# Patient Record
Sex: Female | Born: 1940 | Race: White | Hispanic: No | Marital: Married | Smoking: Never smoker
Health system: Southern US, Community
[De-identification: ages and names within clinical notes are randomized; demographics above are authoritative.]

## PROBLEM LIST (undated history)

## (undated) DIAGNOSIS — G35 Multiple sclerosis: Secondary | ICD-10-CM

## (undated) DIAGNOSIS — K219 Gastro-esophageal reflux disease without esophagitis: Secondary | ICD-10-CM

## (undated) DIAGNOSIS — M1990 Unspecified osteoarthritis, unspecified site: Secondary | ICD-10-CM

## (undated) HISTORY — PX: HERNIA REPAIR: SHX51

## (undated) HISTORY — PX: COLONOSCOPY: SHX174

## (undated) HISTORY — DX: Gastro-esophageal reflux disease without esophagitis: K21.9

## (undated) HISTORY — PX: BREAST LUMPECTOMY: SHX2

## (undated) HISTORY — DX: Multiple sclerosis: G35

## (undated) HISTORY — PX: POLYPECTOMY: SHX149

## (undated) HISTORY — DX: Unspecified osteoarthritis, unspecified site: M19.90

## (undated) HISTORY — PX: ABDOMINAL HYSTERECTOMY: SHX81

---

## 1998-05-10 ENCOUNTER — Other Ambulatory Visit: Admission: RE | Admit: 1998-05-10 | Discharge: 1998-05-10 | Payer: Self-pay | Admitting: Gynecology

## 1998-06-13 ENCOUNTER — Ambulatory Visit (HOSPITAL_BASED_OUTPATIENT_CLINIC_OR_DEPARTMENT_OTHER): Admission: RE | Admit: 1998-06-13 | Discharge: 1998-06-13 | Payer: Self-pay | Admitting: General Surgery

## 1999-08-28 ENCOUNTER — Other Ambulatory Visit: Admission: RE | Admit: 1999-08-28 | Discharge: 1999-08-28 | Payer: Self-pay | Admitting: Gynecology

## 2000-09-09 ENCOUNTER — Other Ambulatory Visit: Admission: RE | Admit: 2000-09-09 | Discharge: 2000-09-09 | Payer: Self-pay | Admitting: Gynecology

## 2001-12-15 ENCOUNTER — Other Ambulatory Visit: Admission: RE | Admit: 2001-12-15 | Discharge: 2001-12-15 | Payer: Self-pay | Admitting: Gynecology

## 2004-01-26 ENCOUNTER — Other Ambulatory Visit: Admission: RE | Admit: 2004-01-26 | Discharge: 2004-01-26 | Payer: Self-pay | Admitting: Gynecology

## 2005-01-31 ENCOUNTER — Other Ambulatory Visit: Admission: RE | Admit: 2005-01-31 | Discharge: 2005-01-31 | Payer: Self-pay | Admitting: Gynecology

## 2005-09-20 ENCOUNTER — Ambulatory Visit: Payer: Self-pay | Admitting: Internal Medicine

## 2005-10-04 ENCOUNTER — Encounter (INDEPENDENT_AMBULATORY_CARE_PROVIDER_SITE_OTHER): Payer: Self-pay | Admitting: Specialist

## 2005-10-04 ENCOUNTER — Ambulatory Visit: Payer: Self-pay | Admitting: Internal Medicine

## 2006-02-03 ENCOUNTER — Other Ambulatory Visit: Admission: RE | Admit: 2006-02-03 | Discharge: 2006-02-03 | Payer: Self-pay | Admitting: Gynecology

## 2007-02-09 ENCOUNTER — Other Ambulatory Visit: Admission: RE | Admit: 2007-02-09 | Discharge: 2007-02-09 | Payer: Self-pay | Admitting: Gynecology

## 2008-02-17 ENCOUNTER — Encounter: Payer: Self-pay | Admitting: Gynecology

## 2008-02-17 ENCOUNTER — Ambulatory Visit: Payer: Self-pay | Admitting: Gynecology

## 2008-02-17 ENCOUNTER — Other Ambulatory Visit: Admission: RE | Admit: 2008-02-17 | Discharge: 2008-02-17 | Payer: Self-pay | Admitting: Gynecology

## 2008-03-28 ENCOUNTER — Ambulatory Visit: Payer: Self-pay | Admitting: Gynecology

## 2008-04-04 ENCOUNTER — Ambulatory Visit: Payer: Self-pay | Admitting: Gynecology

## 2008-08-18 ENCOUNTER — Encounter (INDEPENDENT_AMBULATORY_CARE_PROVIDER_SITE_OTHER): Payer: Self-pay | Admitting: *Deleted

## 2008-09-15 ENCOUNTER — Ambulatory Visit: Payer: Self-pay | Admitting: Internal Medicine

## 2008-09-29 ENCOUNTER — Encounter: Payer: Self-pay | Admitting: Internal Medicine

## 2008-09-29 ENCOUNTER — Ambulatory Visit: Payer: Self-pay | Admitting: Internal Medicine

## 2008-10-03 ENCOUNTER — Encounter: Payer: Self-pay | Admitting: Internal Medicine

## 2009-02-17 ENCOUNTER — Other Ambulatory Visit: Admission: RE | Admit: 2009-02-17 | Discharge: 2009-02-17 | Payer: Self-pay | Admitting: Gynecology

## 2009-02-17 ENCOUNTER — Ambulatory Visit: Payer: Self-pay | Admitting: Gynecology

## 2009-02-22 ENCOUNTER — Ambulatory Visit: Payer: Self-pay | Admitting: Gynecology

## 2009-03-01 ENCOUNTER — Ambulatory Visit (HOSPITAL_COMMUNITY): Admission: RE | Admit: 2009-03-01 | Discharge: 2009-03-01 | Payer: Self-pay | Admitting: Gynecology

## 2009-03-16 ENCOUNTER — Ambulatory Visit
Admission: RE | Admit: 2009-03-16 | Discharge: 2009-03-16 | Payer: Self-pay | Source: Home / Self Care | Admitting: Gynecologic Oncology

## 2009-08-24 ENCOUNTER — Ambulatory Visit: Payer: Self-pay | Admitting: Gynecology

## 2010-02-19 ENCOUNTER — Ambulatory Visit
Admission: RE | Admit: 2010-02-19 | Discharge: 2010-02-19 | Payer: Self-pay | Source: Home / Self Care | Attending: Gynecology | Admitting: Gynecology

## 2010-02-19 ENCOUNTER — Other Ambulatory Visit
Admission: RE | Admit: 2010-02-19 | Discharge: 2010-02-19 | Payer: Self-pay | Source: Home / Self Care | Admitting: Gynecology

## 2011-09-23 ENCOUNTER — Encounter: Payer: Self-pay | Admitting: Internal Medicine

## 2011-10-01 ENCOUNTER — Encounter: Payer: Self-pay | Admitting: Internal Medicine

## 2011-10-03 ENCOUNTER — Ambulatory Visit (AMBULATORY_SURGERY_CENTER): Payer: Medicare Other | Admitting: *Deleted

## 2011-10-03 VITALS — Ht 64.5 in | Wt 143.0 lb

## 2011-10-03 DIAGNOSIS — Z860100 Personal history of colon polyps, unspecified: Secondary | ICD-10-CM

## 2011-10-03 DIAGNOSIS — Z800 Family history of malignant neoplasm of digestive organs: Secondary | ICD-10-CM

## 2011-10-03 DIAGNOSIS — Z1211 Encounter for screening for malignant neoplasm of colon: Secondary | ICD-10-CM

## 2011-10-03 MED ORDER — PEG-KCL-NACL-NASULF-NA ASC-C 100 G PO SOLR: ORAL | Status: DC

## 2011-10-03 NOTE — Progress Notes (Signed)
No allergy to eggs or soy products 

## 2011-11-12 ENCOUNTER — Encounter: Payer: Self-pay | Admitting: Internal Medicine

## 2011-11-12 ENCOUNTER — Ambulatory Visit (AMBULATORY_SURGERY_CENTER): Payer: Medicare Other | Admitting: Internal Medicine

## 2011-11-12 VITALS — BP 132/65 | HR 53 | Temp 98.2°F | Resp 20 | Ht 64.0 in | Wt 143.0 lb

## 2011-11-12 DIAGNOSIS — Z860100 Personal history of colon polyps, unspecified: Secondary | ICD-10-CM

## 2011-11-12 DIAGNOSIS — D126 Benign neoplasm of colon, unspecified: Secondary | ICD-10-CM

## 2011-11-12 DIAGNOSIS — Z800 Family history of malignant neoplasm of digestive organs: Secondary | ICD-10-CM

## 2011-11-12 DIAGNOSIS — Z1211 Encounter for screening for malignant neoplasm of colon: Secondary | ICD-10-CM

## 2011-11-12 MED ORDER — SODIUM CHLORIDE 0.9 % IV SOLN: 500.0000 mL | INTRAVENOUS | Status: DC

## 2011-11-12 NOTE — Progress Notes (Addendum)
Patient did not have preoperative order for IV antibiotic SSI prophylaxis. (G8918)  Patient did not experience any of the following events: a burn prior to discharge; a fall within the facility; wrong site/side/patient/procedure/implant event; or a hospital transfer or hospital admission upon discharge from the facility. (G8907)  

## 2011-11-12 NOTE — Patient Instructions (Addendum)
YOU HAD AN ENDOSCOPIC PROCEDURE TODAY AT THE [facility]: Refer to the procedure report that was given to you for any specific questions about what was found during the examination.  If the procedure report does not answer your questions, please call your gastroenterologist to clarify.  If you requested that your care partner not be given the details of your procedure findings, then the procedure report has been included in a sealed envelope for you to review at your convenience later.  YOU SHOULD EXPECT: Some feelings of bloating in the abdomen. Passage of more gas than usual.  Walking can help get rid of the air that was put into your GI tract during the procedure and reduce the bloating. If you had a lower endoscopy (such as a colonoscopy or flexible sigmoidoscopy) you may notice spotting of blood in your stool or on the toilet paper. If you underwent a bowel prep for your procedure, then you may not have a normal bowel movement for a few days.  DIET: Your first meal following the procedure should be a light meal and then it is ok to progress to your normal diet.  A half-sandwich or bowl of soup is an example of a good first meal.  Heavy or fried foods are harder to digest and may make you feel nauseous or bloated.  Likewise meals heavy in dairy and vegetables can cause extra gas to form and this can also increase the bloating.  Drink plenty of fluids but you should avoid alcoholic beverages for 24 hours.  ACTIVITY: Your care partner should take you home directly after the procedure.  You should plan to take it easy, moving slowly for the rest of the day.  You can resume normal activity the day after the procedure however you should NOT DRIVE or use heavy machinery for 24 hours (because of the sedation medicines used during the test).    SYMPTOMS TO REPORT IMMEDIATELY: A gastroenterologist can be reached at any hour.  During normal business hours, 8:30 AM to 5:00 PM Monday through Friday,  call [phone].  After hours and on weekends, please call the GI answering service at [phone] who will take a message and have the physician on call contact you.   Following lower endoscopy (colonoscopy or flexible sigmoidoscopy):  Excessive amounts of blood in the stool  Significant tenderness or worsening of abdominal pains  Swelling of the abdomen that is new, acute  Fever of 100F or higher  FOLLOW UP: If any biopsies were taken you will be contacted by phone or by letter within the next 1-3 weeks.  Call your gastroenterologist if you have not heard about the biopsies in 3 weeks.  Our staff will call the home number listed on your records the next business day following your procedure to check on you and address any questions or concerns that you may have at that time regarding the information given to you following your procedure. This is a courtesy call and so if there is no answer at the home number and we have not heard from you through the emergency physician on call, we will assume that you have returned to your regular daily activities without incident.  SIGNATURES/CONFIDENTIALITY: You and/or your care partner have signed paperwork which will be entered into your electronic medical record.  These signatures attest to the fact that that the information above on your After Visit Summary has been reviewed and is understood.  Full responsibility of the confidentiality of this   discharge information lies with you and/or your care-partner.   Thank-you for choosing us for your healthcare needs. 

## 2011-11-12 NOTE — Op Note (Signed)
[facility] [address] [place], [address]   COLONOSCOPY PROCEDURE REPORT  PATIENT: Sheena, Horton.  MR#: [id number] BIRTHDATE: 12-09-1940 , 71  yrs. old GENDER: Female ENDOSCOPIST: Roxy Cedar, MD REFERRED XB:[id number] Program Recall PROCEDURE DATE:  11/12/2011 PROCEDURE:   Colonoscopy with snare polypectomy  x 2 ASA CLASS:   Class II INDICATIONS:patient's immediate family history of colon cancer (sib 19); presonal hx multiple adenomas (2003,04,07,10). MEDICATIONS: MAC sedation, administered by CRNA and propofol (Diprivan) 260mg  IV  DESCRIPTION OF PROCEDURE:   After the risks benefits and alternatives of the procedure were thoroughly explained, informed consent was obtained.  A digital rectal exam revealed no abnormalities of the rectum.   The LB CF-[id number]  endoscope was introduced through the anus and advanced to the cecum, which was identified by both the appendix and ileocecal valve. No adverse events experienced.   The quality of the prep was excellent, using MoviPrep  The instrument was then slowly withdrawn as the colon was fully examined.      COLON FINDINGS: Two diminutive polyps were found in the transverse colon and descending colon.  A polypectomy was performed with a cold snare.  The resection was complete and the polyp tissue was completely retrieved.   Moderate diverticulosis was noted in the sigmoid colon.  Retroflexed views revealed no abnormalities. The time to cecum=5 minutes 25 seconds.  Withdrawal time=13 minutes 11 seconds.  The scope was withdrawn and the procedure completed. COMPLICATIONS: There were no complications.  ENDOSCOPIC IMPRESSION: 1.   Two diminutive polyps were found in the transverse colon and descending colon; polypectomy was performed with a cold snare 2.   Moderate diverticulosis was noted in the sigmoid colon  RECOMMENDATIONS: 1. Follow up colonoscopy in 5 years   eSigned:  Roxy Cedar, MD 11/12/2011 9:12 AM   cc: Feliciana Rossetti, MD and The Patient   PATIENT NAME:  Sheena, Horton. MR#: [id number]

## 2011-11-13 ENCOUNTER — Telehealth: Payer: Self-pay

## 2011-11-13 NOTE — Telephone Encounter (Signed)
  Follow up Call-  Call back number 11/12/2011  Post procedure Call Back phone  # [phone]  Permission to leave phone message Yes     Patient questions:  Do you have a fever, pain , or abdominal swelling? no Pain Score  0 *  Have you tolerated food without any problems? yes  Have you been able to return to your normal activities? yes  Do you have any questions about your discharge instructions: Diet   no Medications  no Follow up visit  no  Do you have questions or concerns about your Care? no  Actions: * If pain score is 4 or above: No action needed, pain <4.

## 2011-11-19 ENCOUNTER — Encounter: Payer: Self-pay | Admitting: Internal Medicine

## 2015-04-12 ENCOUNTER — Encounter: Payer: Self-pay | Admitting: Internal Medicine

## 2016-06-01 ENCOUNTER — Ambulatory Visit (HOSPITAL_COMMUNITY)
Admission: EM | Admit: 2016-06-01 | Discharge: 2016-06-01 | Disposition: A | Discharge status: Home / Self Care | Payer: Medicare Other | Attending: Family Medicine | Admitting: Family Medicine

## 2016-06-01 ENCOUNTER — Ambulatory Visit (INDEPENDENT_AMBULATORY_CARE_PROVIDER_SITE_OTHER): Payer: Medicare Other

## 2016-06-01 ENCOUNTER — Encounter (HOSPITAL_COMMUNITY): Payer: Self-pay | Admitting: Family Medicine

## 2016-06-01 DIAGNOSIS — K5900 Constipation, unspecified: Secondary | ICD-10-CM

## 2016-06-01 DIAGNOSIS — R109 Unspecified abdominal pain: Secondary | ICD-10-CM

## 2016-06-01 LAB — POCT URINALYSIS DIP (DEVICE)
Bilirubin Urine: NEGATIVE
GLUCOSE, UA: NEGATIVE mg/dL
Hgb urine dipstick: NEGATIVE
KETONES UR: NEGATIVE mg/dL
LEUKOCYTES UA: NEGATIVE
Nitrite: NEGATIVE
PROTEIN: NEGATIVE mg/dL
SPECIFIC GRAVITY, URINE: 1.025 (ref 1.005–1.030)
UROBILINOGEN UA: 0.2 mg/dL (ref 0.0–1.0)
pH: 7 (ref 5.0–8.0)

## 2016-06-01 NOTE — ED Provider Notes (Signed)
[facility]    CSN: [id number] Arrival date & time: 06/01/16  1203     History   Chief Complaint Chief Complaint  Patient presents with  . Abdominal Pain    HPI Sheena Horton is a 76 y.o. female. She is here with her daughter.  HPI Earlier today, the patient was shopping and noticed a 10/10 pain in her lower abdomen. It was very sharp and caused her to start sweating as well. She had intense nausea when this happens well. Since that time, it is turned into a dull ache and she rates as a 5/10 pain it does not radiate. She has had atypical presentation of urinary tract infections before and states this may be it. She denies any fevers, urinary complaints, current nausea, vomiting, fevers, recent travel, constipation, diarrhea, or bleeding.   Past Medical History:  Diagnosis Date  . Arthritis   . GERD (gastroesophageal reflux disease)   . Multiple sclerosis ([place])    dx in 1984 with mild case of MS, no medications or treatments, has had no symptoms     Past Surgical History:  Procedure Laterality Date  . ABDOMINAL HYSTERECTOMY    . BREAST LUMPECTOMY     left  . HERNIA REPAIR      Home Medications    Prior to Admission medications   Medication Sig Start Date End Date Taking? Authorizing Provider  CALCIUM PO Take 1 tablet by mouth daily.    Historical Provider, MD  Flaxseed, Linseed, (FLAXSEED OIL PO) Take by mouth daily.    Historical Provider, MD  Omega-3 Fatty Acids (FISH OIL PO) Take 1 tablet by mouth daily.    Historical Provider, MD    Family History Family History  Problem Relation Age of Onset  . Colon cancer Brother   . Esophageal cancer Neg Hx   . Rectal cancer Neg Hx   . Stomach cancer Neg Hx     Social History Social History  Substance Use Topics  . Smoking status: Never Smoker  . Smokeless tobacco: Never Used  . Alcohol use No     Allergies   Patient has no known allergies.   Review of Systems Review of Systems    Gastrointestinal: Positive for abdominal pain.  Genitourinary: Negative for difficulty urinating.     Physical Exam Triage Vital Signs ED Triage Vitals [06/01/16 1220]  Enc Vitals Group     BP (!) 190/79     Pulse Rate (!) 52     Resp 18     Temp 98.6 F (37 C)     SpO2 99 %   Updated Vital Signs BP (!) 190/79   Pulse (!) 52   Temp 98.6 F (37 C)   Resp 18   SpO2 99%   Physical Exam  Constitutional: She appears well-developed and well-nourished.  HENT:  Mouth/Throat: Oropharynx is clear and moist.  Cardiovascular: Normal rate and regular rhythm.   No murmur heard. Pulmonary/Chest: Effort normal and breath sounds normal. No respiratory distress.  Abdominal:  Soft, TTP in lower quadrants, worse in LLQ, mild distension, no guarding, Neg Rovsing's, Carnett's, Murphy's, McBurney's  Skin: Skin is warm and dry.  Psychiatric: She has a normal mood and affect. Judgment normal.     UC Treatments / Results  Labs (all labs ordered are listed, but only abnormal results are displayed) Labs Reviewed  POCT URINALYSIS DIP (DEVICE)   Radiology Dg Abdomen 1 View  Result Date: 06/01/2016 CLINICAL DATA:  Abdominal pain, chronic.  EXAM: ABDOMEN - 1 VIEW COMPARISON:  CT abdomen and pelvis March 01, 2009 FINDINGS: There is diffuse stool throughout colon. No bowel dilatation or air-fluid level suggesting obstruction seen. No free air. No abnormal calcifications. Visualized lung bases are clear. IMPRESSION: Diffuse stool throughout colon. No bowel dilatation or air-fluid levels to suggest bowel obstruction. No free air. Electronically Signed   By: Lowella Grip III M.D.   On: 06/01/2016 13:15    Procedures Procedures none  Initial Impression / Assessment and Plan / UC Course  I have reviewed the triage vital signs and the nursing notes.  Pertinent labs & imaging results that were available during my care of the patient were reviewed by me and considered in my medical decision  making (see chart for details).    Exam and imaging are suggestive of constipation. Urinalysis is negative. Recommended increasing her MiraLAX to twice daily over the next 3 days. Use an enema if this is not helpful. Follow-up with primary care physician of all else fails. Also recommended to seek care if she starts having bleeding or fevers. The x-ray was reviewed with the patient and her daughter. She is to be discharged in stable condition. The patient and her daughter voiced understanding and agreement to the plan.   Final Clinical Impressions(s) / UC Diagnoses   Final diagnoses:  Constipation, unspecified constipation type      Sheena Pal, DO 06/01/16 1402

## 2016-06-01 NOTE — ED Triage Notes (Addendum)
Pt here for generalized abd pain more in the lower abdomen that was sudden onset today about 12:30 when she was shopping. sts that she ate at home before she left the house. Sts during the episode she had nausea and diaphoresis. sts pain has decreased since the episode. sts the pain was sharp and now dull.

## 2016-06-01 NOTE — Discharge Instructions (Signed)
Miralax twice daily for next 3 days, enema if no improvement.

## 2016-12-03 ENCOUNTER — Encounter: Payer: Self-pay | Admitting: Internal Medicine

## 2016-12-31 ENCOUNTER — Encounter: Payer: Self-pay | Admitting: Internal Medicine

## 2017-02-20 ENCOUNTER — Ambulatory Visit (AMBULATORY_SURGERY_CENTER): Payer: Self-pay

## 2017-02-20 VITALS — Ht 64.5 in | Wt 136.2 lb

## 2017-02-20 DIAGNOSIS — Z86010 Personal history of colonic polyps: Secondary | ICD-10-CM

## 2017-02-20 DIAGNOSIS — Z800 Family history of malignant neoplasm of digestive organs: Secondary | ICD-10-CM

## 2017-02-20 MED ORDER — PEG-KCL-NACL-NASULF-NA ASC-C 140 G PO SOLR: 1.0000 | Freq: Once | ORAL | Status: AC

## 2017-02-20 NOTE — Progress Notes (Signed)
Per pt, no allergies to soy or egg products.Pt not taking any weight loss meds or using  O2 at home.  Pt refused emmi video. 

## 2017-03-06 ENCOUNTER — Ambulatory Visit (AMBULATORY_SURGERY_CENTER): Payer: Medicare Other | Admitting: Internal Medicine

## 2017-03-06 ENCOUNTER — Encounter: Payer: Self-pay | Admitting: Internal Medicine

## 2017-03-06 ENCOUNTER — Other Ambulatory Visit: Payer: Self-pay

## 2017-03-06 VITALS — BP 116/77 | HR 58 | Temp 96.8°F | Resp 15 | Ht 64.5 in | Wt 136.0 lb

## 2017-03-06 DIAGNOSIS — Z800 Family history of malignant neoplasm of digestive organs: Secondary | ICD-10-CM | POA: Diagnosis not present

## 2017-03-06 DIAGNOSIS — D123 Benign neoplasm of transverse colon: Secondary | ICD-10-CM

## 2017-03-06 DIAGNOSIS — Z86010 Personal history of colonic polyps: Secondary | ICD-10-CM | POA: Diagnosis not present

## 2017-03-06 DIAGNOSIS — D122 Benign neoplasm of ascending colon: Secondary | ICD-10-CM | POA: Diagnosis not present

## 2017-03-06 MED ORDER — SODIUM CHLORIDE 0.9 % IV SOLN: 500.0000 mL | Freq: Once | INTRAVENOUS | Status: AC

## 2017-03-06 NOTE — Op Note (Signed)
[place] Patient Name: Sheena Horton Procedure Date: 03/06/2017 9:54 AM MRN: [id number] Endoscopist: Docia Chuck. Henrene Pastor , MD Age: 77 Referring MD:  Date of Birth: [birth year] Gender: Female Account #: [id number] Procedure:                Colonoscopy, With cold snare polypectomy x 2 Indications:              High risk colon cancer surveillance: Personal                            history of multiple (3 or more) adenomas. Previous                            examinations 2000, 2003, 2004, 2007, 2010, 2013.                            Sibling with colon cancer at 49 Medicines:                Monitored Anesthesia Care Procedure:                Pre-Anesthesia Assessment:                           - Prior to the procedure, a History and Physical                            was performed, and patient medications and                            allergies were reviewed. The patient's tolerance of                            previous anesthesia was also reviewed. The risks                            and benefits of the procedure and the sedation                            options and risks were discussed with the patient.                            All questions were answered, and informed consent                            was obtained. Prior Anticoagulants: The patient has                            taken no previous anticoagulant or antiplatelet                            agents. ASA Grade Assessment: II - A patient with                            mild systemic disease. After reviewing the risks  and benefits, the patient was deemed in                            satisfactory condition to undergo the procedure.                           After obtaining informed consent, the colonoscope                            was passed under direct vision. Throughout the                            procedure, the patient's blood pressure, pulse, and   oxygen saturations were monitored continuously. The                            Model CF-HQ190L [id number]) scope was introduced                            through the anus and advanced to the the cecum,                            identified by appendiceal orifice and ileocecal                            valve. The ileocecal valve, appendiceal orifice,                            and rectum were photographed. The quality of the                            bowel preparation was excellent. The colonoscopy                            was performed without difficulty. The patient                            tolerated the procedure well. The bowel preparation                            used was SUPREP. Scope In: 10:00:12 AM Scope Out: 10:20:12 AM Scope Withdrawal Time: 0 hours 14 minutes 46 seconds  Total Procedure Duration: 0 hours 20 minutes 0 seconds  Findings:                 Two polyps were found in the transverse colon and                            ascending colon. The polyps were 2 mm in size.                            These polyps were removed with a cold snare.  Resection and retrieval were complete.                           Multiple diverticula were found in the sigmoid                            colon and ascending colon.                           Internal hemorrhoids were found during retroflexion.                           The exam was otherwise without abnormality on                            direct and retroflexion views. Complications:            No immediate complications. Estimated blood loss:                            None. Estimated Blood Loss:     Estimated blood loss: none. Impression:               - Two 2 mm polyps in the transverse colon and in                            the ascending colon, removed with a cold snare.                            Resected and retrieved.                           - Diverticulosis in the sigmoid colon and in the                             ascending colon.                           - Internal hemorrhoids.                           - The examination was otherwise normal on direct                            and retroflexion views. Recommendation:           - Repeat colonoscopy in 5 years for surveillance.                           - Patient has a contact number available for                            emergencies. The signs and symptoms of potential                            delayed complications were discussed with the  patient. Return to normal activities tomorrow.                            Written discharge instructions were provided to the                            patient.                           - Resume previous diet.                           - Continue present medications.                           - Await pathology results. Docia Chuck. Henrene Pastor, MD 03/06/2017 10:24:44 AM This report has been signed electronically.

## 2017-03-06 NOTE — Progress Notes (Signed)
To PACU, VSS. Report to [email or web address] 

## 2017-03-06 NOTE — Progress Notes (Signed)
Pt's states no medical or surgical changes since previsit or office visit. 

## 2017-03-06 NOTE — Patient Instructions (Signed)
YOU HAD AN ENDOSCOPIC PROCEDURE TODAY AT [facility]:   Refer to the procedure report that was given to you for any specific questions about what was found during the examination.  If the procedure report does not answer your questions, please call your gastroenterologist to clarify.  If you requested that your care partner not be given the details of your procedure findings, then the procedure report has been included in a sealed envelope for you to review at your convenience later.  YOU SHOULD EXPECT: Some feelings of bloating in the abdomen. Passage of more gas than usual.  Walking can help get rid of the air that was put into your GI tract during the procedure and reduce the bloating. If you had a lower endoscopy (such as a colonoscopy or flexible sigmoidoscopy) you may notice spotting of blood in your stool or on the toilet paper. If you underwent a bowel prep for your procedure, you may not have a normal bowel movement for a few days.  Please Note:  You might notice some irritation and congestion in your nose or some drainage.  This is from the oxygen used during your procedure.  There is no need for concern and it should clear up in a day or so.  SYMPTOMS TO REPORT IMMEDIATELY:   Following lower endoscopy (colonoscopy or flexible sigmoidoscopy):  Excessive amounts of blood in the stool  Significant tenderness or worsening of abdominal pains  Swelling of the abdomen that is new, acute  Fever of 100F or higher  For urgent or emergent issues, a gastroenterologist can be reached at any hour by calling [phone].   DIET:  We do recommend a small meal at first, but then you may proceed to your regular diet.  Drink plenty of fluids but you should avoid alcoholic beverages for 24 hours. Try to increase the fiber in your diet, and drink plenty of water.  ACTIVITY:  You should plan to take it easy for the rest of today and you should NOT DRIVE or use heavy machinery until  tomorrow (because of the sedation medicines used during the test).    FOLLOW UP: Our staff will call the number listed on your records the next business day following your procedure to check on you and address any questions or concerns that you may have regarding the information given to you following your procedure. If we do not reach you, we will leave a message.  However, if you are feeling well and you are not experiencing any problems, there is no need to return our call.  We will assume that you have returned to your regular daily activities without incident.  If any biopsies were taken you will be contacted by phone or by letter within the next 1-3 weeks.  Please call us at [phone] if you have not heard about the biopsies in 3 weeks.    SIGNATURES/CONFIDENTIALITY: You and/or your care partner have signed paperwork which will be entered into your electronic medical record.  These signatures attest to the fact that that the information above on your After Visit Summary has been reviewed and is understood.  Full responsibility of the confidentiality of this discharge information lies with you and/or your care-partner.  Thank-you for choosing [place] for your healthcare needs today.

## 2017-03-06 NOTE — Progress Notes (Signed)
Called to room to assist during endoscopic procedure.  Patient ID and intended procedure confirmed with present staff. Received instructions for my participation in the procedure from the performing physician.  

## 2017-03-07 ENCOUNTER — Telehealth: Payer: Self-pay

## 2017-03-07 NOTE — Telephone Encounter (Signed)
Attempted to reach patient for post-procedure f/u call. Line is busy, unable to leave message. Will make another attempt to reach patient later today.

## 2017-03-07 NOTE — Telephone Encounter (Signed)
Second phone attempt. Getting a fast busy signal cannot leave a message.

## 2017-03-11 ENCOUNTER — Encounter: Payer: Self-pay | Admitting: Internal Medicine

## 2017-11-28 IMAGING — DX DG ABDOMEN 1V
1 series · 1 of 1 positions shown · non-contrast
Comparison: CT abdomen and pelvis March 01, 2009

CLINICAL DATA: Abdominal pain, chronic.

EXAM:
ABDOMEN - 1 VIEW

[abdomen kub]
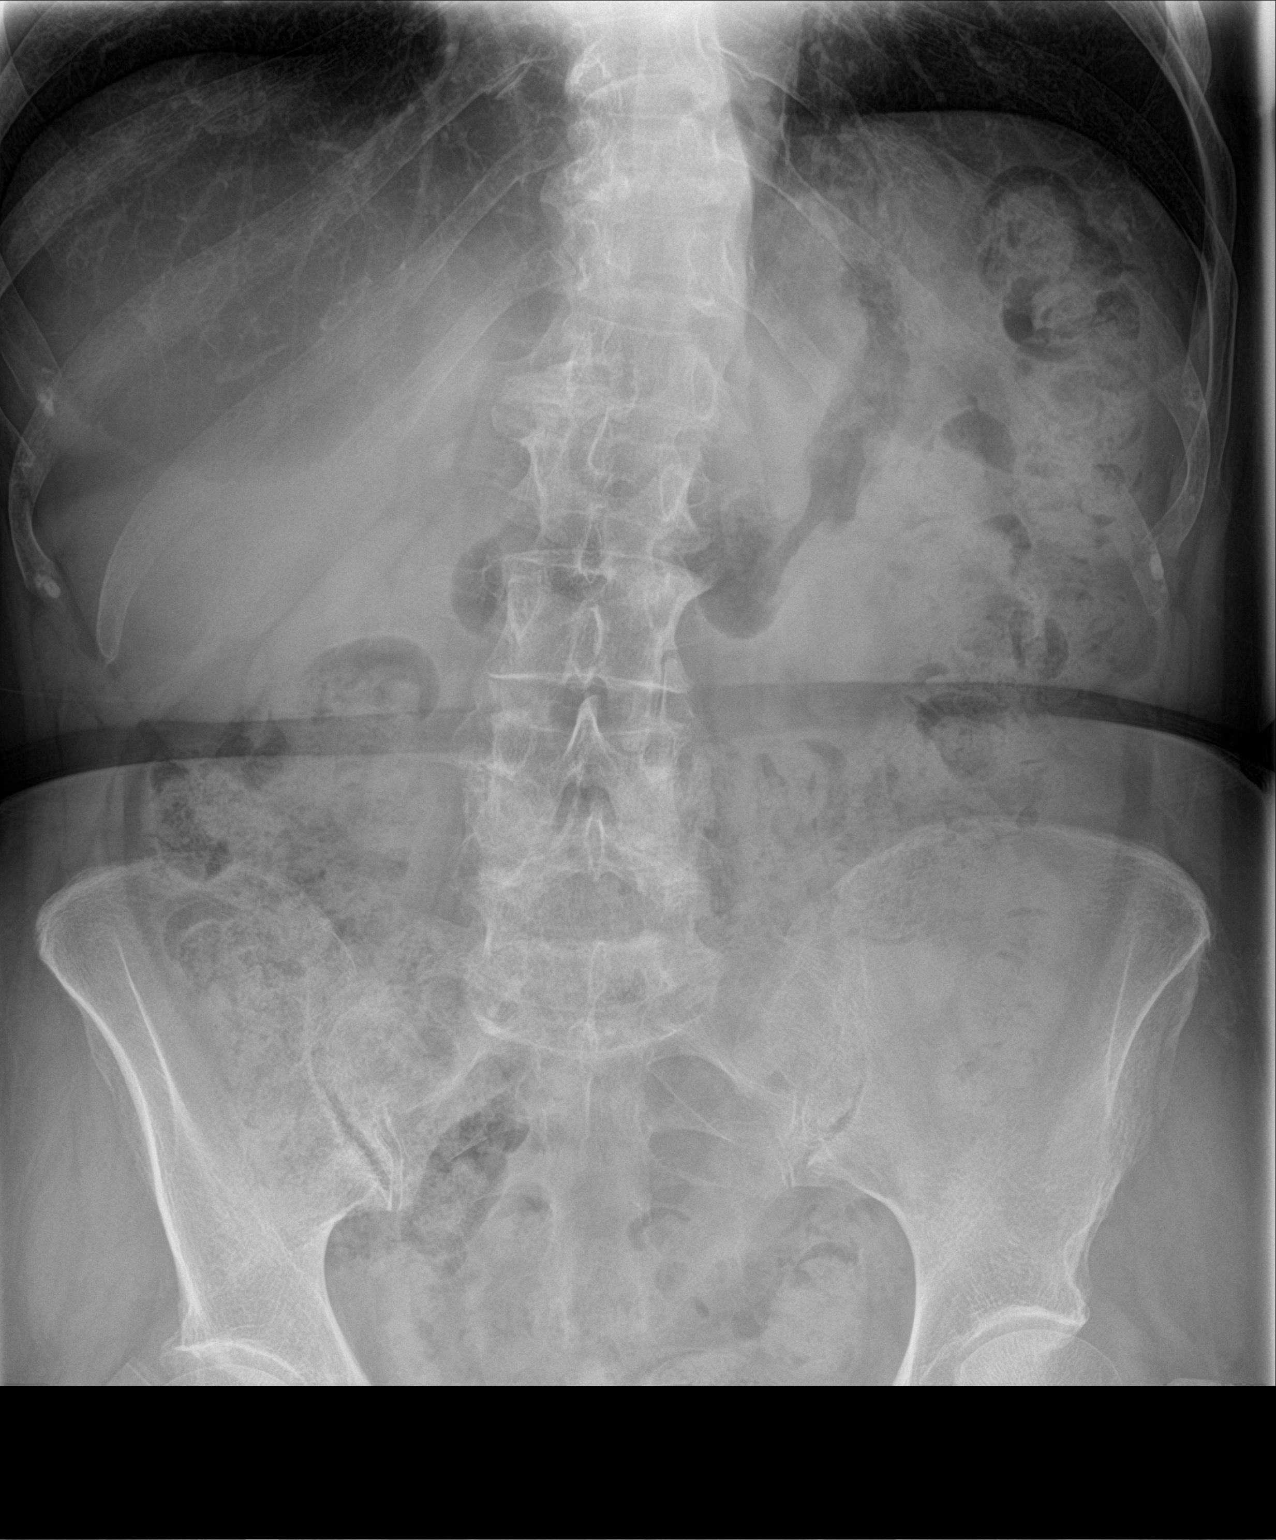

[1 of 1 positions shown; findings below may reference images not displayed]

FINDINGS: There is diffuse stool throughout colon. No bowel dilatation or
air-fluid level suggesting obstruction seen. No free air. No
abnormal calcifications. Visualized lung bases are clear.
IMPRESSION: Diffuse stool throughout colon. No bowel dilatation or air-fluid
levels to suggest bowel obstruction. No free air.

## 2021-08-20 ENCOUNTER — Other Ambulatory Visit: Payer: Self-pay | Admitting: Specialist

## 2021-08-20 DIAGNOSIS — R55 Syncope and collapse: Secondary | ICD-10-CM

## 2021-09-04 ENCOUNTER — Other Ambulatory Visit: Payer: Medicare Other

## 2021-09-05 ENCOUNTER — Ambulatory Visit
Admission: RE | Admit: 2021-09-05 | Discharge: 2021-09-05 | Disposition: A | Discharge status: Home / Self Care | Payer: Medicare Other | Source: Ambulatory Visit | Attending: Specialist | Admitting: Specialist

## 2021-09-05 DIAGNOSIS — R55 Syncope and collapse: Secondary | ICD-10-CM

## 2021-09-05 MED ORDER — GADOBENATE DIMEGLUMINE 529 MG/ML IV SOLN
10.0000 mL | Freq: Once | INTRAVENOUS | Status: AC | PRN
  Administered 2021-09-05: 10 mL via INTRAVENOUS

## 2022-07-08 ENCOUNTER — Ambulatory Visit: Payer: Medicare Other | Admitting: Podiatry

## 2022-07-08 DIAGNOSIS — L603 Nail dystrophy: Secondary | ICD-10-CM | POA: Diagnosis not present

## 2022-07-08 DIAGNOSIS — L600 Ingrowing nail: Secondary | ICD-10-CM | POA: Diagnosis not present

## 2022-07-08 DIAGNOSIS — M2042 Other hammer toe(s) (acquired), left foot: Secondary | ICD-10-CM

## 2022-07-08 NOTE — Progress Notes (Signed)
Subjective:  Patient ID: Sheena Horton, female    DOB: 1940/04/13,  MRN: [id number]  Chief Complaint  Patient presents with   Nail Problem    Nail fungus to bilateral hallux. Has not been using any OTC medications or been treated for nail fungus in the past. Only has pain to the nails when she hits it accidentally. Patient is not diabetic.    Hammer Toe    Hammertoe to 2nd toe on left foot. Patient denies any pain at this time. Toe sometimes rubs against her shoe. Uses spacers in between hallux and 2nd toe.     82 y.o. female presents with concern for bilateral second hammertoe left worse than right as well as nail fungus and deformity as well as pain to the bilateral hallux toenails.  Patient states that hammertoes are do not really cause her any pain she does want to have them evaluated.  She has been using a gel spacer.  In regards to the great toenails on both feet there is some pain especially with the left foot great toenail which is thickened growth and curving into both borders of the toenail causing pain when there are any pressure on the nail.  Past Medical History:  Diagnosis Date   Arthritis    no meds   GERD (gastroesophageal reflux disease)    Multiple sclerosis (HCC)    dx in 1984 with mild case of MS, no medications or treatments, has had no symptoms    No Known Allergies  ROS: Negative except as per HPI above  Objective:  General: AAO x3, NAD  Dermatological: Bilateral hallux nail with thickening dystrophy incurvation and pincer nail deformity with pain on the left hallux nail.  Vascular:  Dorsalis Pedis artery and Posterior Tibial artery pedal pulses are 2/4 bilateral.  Capillary fill time < 3 sec to all digits.   Neruologic: Grossly intact via light touch bilateral. Protective threshold intact to all sites bilateral.   Musculoskeletal: Hammertoe deformity noted bilateral foot  Gait: Unassisted, Nonantalgic.   No images are attached to the  encounter.  Radiographs:  Deferred Assessment:   1. Ingrown nail of great toe of left foot   2. Ingrown nail of great toe of right foot   3. Nail dystrophy   4. 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 -Discussed treatment options for the onychodystrophy seen in both hallux nail -Could try antifungal treatment however I find that this would be ineffective for the severity of the dystrophy and her nails -Would rather recommend total hallux nail removal bilaterally with phenol and alcohol matrixectomy to prevent the recurrence of the dystrophic nail -Patient is interested in proceeding with this we discussed the procedure however she wants to defer at this time as she works in the garden a lot and is concerned that she would not be able to keep her feet totally clean during the healing process -Will likely have the patient follow-up in the fall to proceed with bilateral hallux total nail phenol and alcohol matrixectomy at that time.  # Hammertoe second toe left foot -Discussed with patient she does have a hammertoe deformity however fortunately is not painful -I recommend continued monitoring of the issue. -Recommend use of a gel spacer in between the toes or gel toe cap which was provided to the patient  Return in about  3 months (around 10/08/2022) for nail removal bilateral hallux.          Corinna Gab, DPM [facility] / [facility]

## 2022-07-16 ENCOUNTER — Encounter: Payer: Self-pay | Admitting: Internal Medicine

## 2022-07-30 ENCOUNTER — Encounter: Payer: Self-pay | Admitting: Internal Medicine

## 2022-10-29 ENCOUNTER — Ambulatory Visit: Payer: Medicare Other | Admitting: Podiatry

## 2022-11-01 ENCOUNTER — Ambulatory Visit: Payer: Medicare Other | Admitting: Internal Medicine

## 2023-02-07 ENCOUNTER — Ambulatory Visit: Payer: PPO | Admitting: Internal Medicine

## 2023-02-07 ENCOUNTER — Encounter: Payer: Self-pay | Admitting: Internal Medicine

## 2023-02-07 VITALS — BP 120/80 | HR 72 | Ht 64.5 in | Wt 125.0 lb

## 2023-02-07 DIAGNOSIS — Z8719 Personal history of other diseases of the digestive system: Secondary | ICD-10-CM | POA: Diagnosis not present

## 2023-02-07 DIAGNOSIS — A09 Infectious gastroenteritis and colitis, unspecified: Secondary | ICD-10-CM

## 2023-02-07 DIAGNOSIS — K5909 Other constipation: Secondary | ICD-10-CM

## 2023-02-07 DIAGNOSIS — Z800 Family history of malignant neoplasm of digestive organs: Secondary | ICD-10-CM | POA: Diagnosis not present

## 2023-02-07 DIAGNOSIS — R109 Unspecified abdominal pain: Secondary | ICD-10-CM

## 2023-02-07 DIAGNOSIS — K5900 Constipation, unspecified: Secondary | ICD-10-CM

## 2023-02-07 DIAGNOSIS — Z860101 Personal history of adenomatous and serrated colon polyps: Secondary | ICD-10-CM | POA: Diagnosis not present

## 2023-02-07 DIAGNOSIS — Z860100 Personal history of colon polyps, unspecified: Secondary | ICD-10-CM

## 2023-02-07 NOTE — Patient Instructions (Signed)
 Please follow up as needed.  _______________________________________________________  If your blood pressure at your visit was 140/90 or greater, please contact your primary care physician to follow up on this.  _______________________________________________________  If you are age 83 or older, your body mass index should be between 23-30. Your Body mass index is 21.12 kg/m. If this is out of the aforementioned range listed, please consider follow up with your Primary Care Provider.  If you are age 31 or younger, your body mass index should be between 19-25. Your Body mass index is 21.12 kg/m. If this is out of the aformentioned range listed, please consider follow up with your Primary Care Provider.   ________________________________________________________  The  GI providers would like to encourage you to use MYCHART to communicate with providers for non-urgent requests or questions.  Due to long hold times on the telephone, sending your provider a message by [facility] may be a faster and more efficient way to get a response.  Please allow 48 business hours for a response.  Please remember that this is for non-urgent requests.  _______________________________________________________

## 2023-02-07 NOTE — Progress Notes (Signed)
 HISTORY OF PRESENT ILLNESS:  Sheena Horton is a 83 y.o. female with past medical history as listed below who presents today regarding the need for surveillance colonoscopy.  She is accompanied by her daughter.  The patient has a history of multiple adenomatous colon polyps as well as a family history of colon cancer in her brother around age 65.  She has undergone colonoscopy in 1990, 2003, 2004, 2007, 2010, 2013, and 2019.  On her most recent examination she was found to have 2 diminutive adenomas, diverticulosis, and internal hemorrhoids.  Follow-up surveillance in 5 years to be considered.  Patient tells me that she has had no GI issues except for November 2023 when after eating a salad and a vegetarian pizza she subsequently developed abdominal pain followed by 2 normal bowel movements and subsequently diarrhea.  Stomach was upset over the course of the next day.  Her problem resolved thereafter without recurrence.  She does have chronic stable constipation for which she takes 1 dose of MiraLAX daily.  Typically has a daily bowel movement.  She remains active.  No rectal bleeding.  Weight has been stable.  She is accompanied today by her daughter.   Review of blood work from July 2024 shows unremarkable comprehensive metabolic panel.  Hemoglobin A1c 5.8.  Normal CBC with hemoglobin 14.2.  REVIEW OF SYSTEMS:  All non-GI ROS negative unless otherwise stated in the HPI except for arthritis, excessive urination, urinary leakage  Past Medical History:  Diagnosis Date   Arthritis    no meds   GERD (gastroesophageal reflux disease)    Multiple sclerosis (HCC)    dx in 1984 with mild case of MS, no medications or treatments, has had no symptoms    Past Surgical History:  Procedure Laterality Date   ABDOMINAL HYSTERECTOMY     BREAST LUMPECTOMY     left/benign   COLONOSCOPY     HERNIA REPAIR     left inguinal   POLYPECTOMY      Social History [name]  reports that she has  never smoked. She has never used smokeless tobacco. She reports that she does not drink alcohol and does not use drugs.  family history includes Breast cancer in her mother; Colon cancer in her brother; Colon polyps in her brother; Heart failure in her father.  No Known Allergies     PHYSICAL EXAMINATION: Vital signs: BP 120/80 (BP Location: Left Arm, Patient Position: Sitting, Cuff Size: Normal)   Pulse 72   Ht 5' 4.5 (1.638 m)   Wt 125 lb (56.7 kg)   SpO2 98%   BMI 21.12 kg/m   Constitutional: generally well-appearing, no acute distress Psychiatric: alert and oriented x3, cooperative Eyes: extraocular movements intact, anicteric, conjunctiva pink Mouth: oral pharynx moist, no lesions Neck: supple no lymphadenopathy Cardiovascular: heart regular rate and rhythm, no murmur Lungs: clear to auscultation bilaterally Abdomen: soft, nontender, nondistended, no obvious ascites, no peritoneal signs, normal bowel sounds, no organomegaly Rectal: Omitted Extremities: no clubbing, cyanosis, or lower extremity edema bilaterally Skin: no lesions on visible extremities Neuro: No focal deficits.  Cranial nerves intact  ASSESSMENT:  1.  Personal history of adenomatous colon polyps.  Last colonoscopy 2019 with 2 diminutive adenomas, diverticulosis, and hemorrhoids.  Currently asymptomatic normal hemoglobin.  At age 5 we discussed the pros and cons of surveillance colonoscopy. 2.  Family history of colon cancer in sibling greater than age 53 3.  Self-limited viral illness November 2023 associated with abdominal cramping and diarrhea  PLAN:  1.  At this point, I recommend no further surveillance examinations.  We discussed the pros and cons of this approach.  They are comfortable 2.  Resume general medical care with PCP 3.  GI follow-up as needed A total time of 45 minutes was spent preparing to see the patient, obtaining comprehensive history, performing medically appropriate physical  examination, counseling and educating the patient and her daughter regarding the above listed issues, and documenting clinical information in the health record
